# Patient Record
Sex: Male | Born: 1980 | Race: Black or African American | Hispanic: No | Marital: Single | Smoking: Never smoker
Health system: Southern US, Community
[De-identification: ages and names within clinical notes are randomized; demographics above are authoritative.]

---

## 2014-08-28 ENCOUNTER — Emergency Department (HOSPITAL_COMMUNITY)
Admission: EM | Admit: 2014-08-28 | Discharge: 2014-08-28 | Disposition: A | Discharge status: Home / Self Care | Payer: BLUE CROSS/BLUE SHIELD | Attending: Emergency Medicine | Admitting: Emergency Medicine

## 2014-08-28 ENCOUNTER — Emergency Department (HOSPITAL_COMMUNITY): Payer: BLUE CROSS/BLUE SHIELD

## 2014-08-28 ENCOUNTER — Encounter (HOSPITAL_COMMUNITY): Payer: Self-pay | Admitting: Emergency Medicine

## 2014-08-28 DIAGNOSIS — K115 Sialolithiasis: Secondary | ICD-10-CM | POA: Insufficient documentation

## 2014-08-28 DIAGNOSIS — K118 Other diseases of salivary glands: Secondary | ICD-10-CM | POA: Diagnosis not present

## 2014-08-28 DIAGNOSIS — Z79899 Other long term (current) drug therapy: Secondary | ICD-10-CM | POA: Diagnosis not present

## 2014-08-28 DIAGNOSIS — Z792 Long term (current) use of antibiotics: Secondary | ICD-10-CM | POA: Diagnosis not present

## 2014-08-28 DIAGNOSIS — K1370 Unspecified lesions of oral mucosa: Secondary | ICD-10-CM | POA: Diagnosis present

## 2014-08-28 DIAGNOSIS — R221 Localized swelling, mass and lump, neck: Secondary | ICD-10-CM

## 2014-08-28 DIAGNOSIS — K1120 Sialoadenitis, unspecified: Secondary | ICD-10-CM

## 2014-08-28 LAB — CBC
HCT: 44.5 % (ref 39.0–52.0)
Hemoglobin: 15.7 g/dL (ref 13.0–17.0)
MCH: 30 pg (ref 26.0–34.0)
MCHC: 35.3 g/dL (ref 30.0–36.0)
MCV: 85.1 fL (ref 78.0–100.0)
PLATELETS: 258 10*3/uL (ref 150–400)
RBC: 5.23 MIL/uL (ref 4.22–5.81)
RDW: 12.2 % (ref 11.5–15.5)
WBC: 7.9 10*3/uL (ref 4.0–10.5)

## 2014-08-28 LAB — I-STAT CHEM 8, ED
BUN: 13 mg/dL (ref 6–20)
CHLORIDE: 97 mmol/L — AB (ref 101–111)
CREATININE: 0.9 mg/dL (ref 0.61–1.24)
Calcium, Ion: 1.16 mmol/L (ref 1.12–1.23)
Glucose, Bld: 100 mg/dL — ABNORMAL HIGH (ref 65–99)
HEMATOCRIT: 50 % (ref 39.0–52.0)
Hemoglobin: 17 g/dL (ref 13.0–17.0)
Potassium: 5.7 mmol/L — ABNORMAL HIGH (ref 3.5–5.1)
SODIUM: 134 mmol/L — AB (ref 135–145)
TCO2: 29 mmol/L (ref 0–100)

## 2014-08-28 MED ORDER — HYDROCODONE-ACETAMINOPHEN 5-325 MG PO TABS
2.0000 | ORAL_TABLET | ORAL | Status: DC | PRN
Start: 2014-08-28 — End: 2017-12-12

## 2014-08-28 MED ORDER — KETOROLAC TROMETHAMINE 30 MG/ML IJ SOLN
30.0000 mg | Freq: Once | INTRAMUSCULAR | Status: AC
  Administered 2014-08-28: 30 mg via INTRAVENOUS
  Filled 2014-08-28: qty 1

## 2014-08-28 MED ORDER — IBUPROFEN 800 MG PO TABS: 800.0000 mg | ORAL_TABLET | Freq: Three times a day (TID) | ORAL | Status: DC

## 2014-08-28 MED ORDER — IOHEXOL 300 MG/ML  SOLN
100.0000 mL | Freq: Once | INTRAMUSCULAR | Status: AC | PRN
  Administered 2014-08-28: 100 mL via INTRAVENOUS

## 2014-08-28 NOTE — Discharge Instructions (Signed)
Continue taking the Clindamycin  Suck on lemon drops  Motrin or hydrocodone for pain

## 2014-08-28 NOTE — ED Notes (Signed)
Awake. Verbally responsive. A/O x4. Resp even and unlabored. No audible adventitious breath sounds noted. ABC's intact.  

## 2014-08-28 NOTE — ED Notes (Signed)
Pt reported wanting evaluation of salivary gland to make sure not blocked. Pt reported area has improved with ABT. A/O x4. Resp even and unlabored. No audible adventitious breath sounds noted. ABC's intact. Pt reported some difficulty with swallowing and chewing.

## 2014-08-28 NOTE — ED Notes (Signed)
Pt was diagnosed with lower salivary gland infection on Thursday. Was prescribed abx, which helped with redness and swelling. Still has some swelling. Concerned for possible salivary gland stone, so came in for evaluation

## 2014-08-28 NOTE — ED Provider Notes (Signed)
CSN: [id number]     Arrival date & time 08/28/14  4098 History   First MD Initiated Contact with Patient 08/28/14 [phone]     Chief Complaint  Patient presents with  . Mouth Lesions     (Consider location/radiation/quality/duration/timing/severity/associated sxs/prior Treatment) HPI Comments: Pt is a 34 y/o male - hx of no PMH - he has had swelling under his tongue for several days - this is associated with pain in the R neck under the jaw - it is constant, gradually worsening, not associated with fevers but he does have pain with swallowing.  He saw urgent care 2 days ago and was Rx Clinda for possible gland infection - has has 2 days q 6 hours 300 clinda with minimal changes.    Patient is a 34 y.o. male presenting with mouth sores. The history is provided by the patient.  Mouth Lesions Associated symptoms: neck pain   Associated symptoms: no fever     History reviewed. No pertinent past medical history. History reviewed. No pertinent past surgical history. History reviewed. No pertinent family history. History  Substance Use Topics  . Smoking status: Never Smoker   . Smokeless tobacco: Not on file  . Alcohol Use: No    Review of Systems  Constitutional: Negative for fever and chills.  HENT: Positive for mouth sores.   Respiratory: Negative for shortness of breath.   Cardiovascular: Negative for chest pain.  Gastrointestinal: Negative for nausea and vomiting.  Musculoskeletal: Positive for neck pain.      Allergies  Review of patient's allergies indicates no known allergies.  Home Medications   Prior to Admission medications   Medication Sig Start Date End Date Taking? Authorizing Provider  acetaminophen (TYLENOL) 500 MG tablet Take 500 mg by mouth every 6 (six) hours as needed for mild pain or headache.   Yes Historical Provider, MD  clindamycin (CLEOCIN) 300 MG capsule Take 300 mg by mouth 4 (four) times daily.   Yes Historical Provider, MD  Flaxseed, Linseed, (FLAX  SEED OIL PO) Take 1 capsule by mouth daily.   Yes Historical Provider, MD  HYDROcodone-acetaminophen (NORCO/VICODIN) 5-325 MG per tablet Take 2 tablets by mouth every 4 (four) hours as needed. 08/28/14   Eber Hong, MD  ibuprofen (ADVIL,MOTRIN) 800 MG tablet Take 1 tablet (800 mg total) by mouth 3 (three) times daily. 08/28/14   Eber Hong, MD   BP 138/85 mmHg  Pulse 58  Temp(Src) 98.7 F (37.1 C) (Oral)  Resp 20  SpO2 98% Physical Exam  Constitutional: He appears well-developed and well-nourished. No distress.  HENT:  Head: Normocephalic and atraumatic.  Mouth/Throat: Oropharynx is clear and moist.  Dentition without acute findings - submandibular gland swollen on the R with duct swollen with purulent material drainage expresse dfrom the duct  Eyes: Conjunctivae and EOM are normal. Pupils are equal, round, and reactive to light. Right eye exhibits no discharge. Left eye exhibits no discharge. No scleral icterus.  Neck: Normal range of motion. Neck supple. No JVD present.  No thyromegaly, swelling submandibular mass  Cardiovascular: Normal rate, regular rhythm, normal heart sounds and intact distal pulses.  Exam reveals no gallop and no friction rub.   No murmur heard. Pulmonary/Chest: Effort normal and breath sounds normal. No respiratory distress. He has no wheezes. He has no rales.  Abdominal: Soft. Bowel sounds are normal. He exhibits no distension and no mass. There is no tenderness.  Musculoskeletal: Normal range of motion. He exhibits no edema or tenderness.  Neurological:  He is alert. Coordination normal.  Skin: Skin is warm and dry. No rash noted. No erythema.  Psychiatric: He has a normal mood and affect. His behavior is normal.  Nursing note and vitals reviewed.   ED Course  Procedures (including critical care time) Labs Review Labs Reviewed  I-STAT CHEM 8, ED - Abnormal; Notable for the following:    Sodium 134 (*)    Potassium 5.7 (*)    Chloride 97 (*)    Glucose,  Bld 100 (*)    All other components within normal limits  CBC    Imaging Review Ct Soft Tissue Neck W Contrast  08/28/2014   CLINICAL DATA:  Right submandibular gland infection/inflammation. Question stone.  EXAM: CT NECK WITH CONTRAST  TECHNIQUE: Multidetector CT imaging of the neck was performed using the standard protocol following the bolus administration of intravenous contrast.  CONTRAST:  OMNIPAQUE IOHEXOL 300 MG/ML  SOLN  COMPARISON:  None.  FINDINGS: Pharynx and larynx: No focal mucosal or submucosal lesions are present. Tongue base is within normal limits. Vocal cords are midline and symmetric.  Salivary glands: Marked edema and asymmetric enhancement is present within an enlarged right submandibular gland. The right submandibular duct is markedly dilated, measuring up to 7 mm. An obstructing distal sialolith measures 10 x 6.5 x 6.5 mm. A more distal sialolith measures 7 x 4 x 4 mm. No additional stones are evident within the gland. The intraglandular ducts are dilated as well. There is no stone or obstruction on the left. The left submandibular gland is within normal limits. The parotid glands are normal bilaterally.  Thyroid: Negative.  Lymph nodes: Asymmetric enlargement of right level 1 B and level 2 lymph nodes are likely reactive  Vascular: Negative  Limited intracranial: Within normal limits  Visualized orbits: Negative  Mastoids and visualized paranasal sinuses: Clear  Skeleton: Unremarkable  Upper chest: The lung apices are clear.  IMPRESSION: 1. Two large obstructing stones within the distal right submandibular duct. The larger stone measures 10 x 6.5 x 6.5 mm. The more distal stone measures 7 x 4 x 4 mm. 2. Marked dilation of the proximal right submandibular duct an intraglandular ducts with inflammatory changes about the right submandibular gland. 3. Asymmetric right level 1 B and level 2 adenopathy is likely reactive. 4. No other salivary gland stones are present.   Electronically  Signed   By: Marin Roberts M.D.   On: 08/28/2014 11:34      MDM   Final diagnoses:  Neck swelling  Submandibular gland infection  Sialolithiasis of submandibular gland    The patient has normal phonation, able to tolerate secretions, there is no swelling of the oropharynx, he has tenderness under the tongue where this swollen duct is with purulent material expressed, CT scan ordered to evaluate the extent of this infection.  D/w Dr. Emeline Darling who agrees with pt in office on Monday - continued Clindamycin - pt given results and is in agreement with plan.  Obstructed salivary duct  Meds given in ED:  Medications  iohexol (OMNIPAQUE) 300 MG/ML solution 100 mL (100 mLs Intravenous Contrast Given 08/28/14 1034)  ketorolac (TORADOL) 30 MG/ML injection 30 mg (30 mg Intravenous Given 08/28/14 1207)    Discharge Medication List as of 08/28/2014 12:23 PM    START taking these medications   Details  HYDROcodone-acetaminophen (NORCO/VICODIN) 5-325 MG per tablet Take 2 tablets by mouth every 4 (four) hours as needed., Starting 08/28/2014, Until Discontinued, Print  Eber HongBrian Oneisha Ammons, MD 08/28/14 [phone]

## 2014-10-08 ENCOUNTER — Encounter (HOSPITAL_COMMUNITY): Payer: Self-pay | Admitting: *Deleted

## 2014-10-08 ENCOUNTER — Emergency Department (HOSPITAL_COMMUNITY)
Admission: EM | Admit: 2014-10-08 | Discharge: 2014-10-08 | Disposition: A | Discharge status: Home / Self Care | Payer: BLUE CROSS/BLUE SHIELD | Attending: Emergency Medicine | Admitting: Emergency Medicine

## 2014-10-08 DIAGNOSIS — S161XXA Strain of muscle, fascia and tendon at neck level, initial encounter: Secondary | ICD-10-CM | POA: Diagnosis not present

## 2014-10-08 DIAGNOSIS — Z79899 Other long term (current) drug therapy: Secondary | ICD-10-CM | POA: Insufficient documentation

## 2014-10-08 DIAGNOSIS — Y92410 Unspecified street and highway as the place of occurrence of the external cause: Secondary | ICD-10-CM | POA: Insufficient documentation

## 2014-10-08 DIAGNOSIS — Y998 Other external cause status: Secondary | ICD-10-CM | POA: Insufficient documentation

## 2014-10-08 DIAGNOSIS — S199XXA Unspecified injury of neck, initial encounter: Secondary | ICD-10-CM | POA: Diagnosis present

## 2014-10-08 DIAGNOSIS — Y9389 Activity, other specified: Secondary | ICD-10-CM | POA: Diagnosis not present

## 2014-10-08 MED ORDER — CYCLOBENZAPRINE HCL 10 MG PO TABS: 10.0000 mg | ORAL_TABLET | Freq: Two times a day (BID) | ORAL | Status: DC | PRN

## 2014-10-08 MED ORDER — MELOXICAM 15 MG PO TABS: 15.0000 mg | ORAL_TABLET | Freq: Every day | ORAL | Status: DC
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
## 2014-10-08 NOTE — Discharge Instructions (Signed)
Take mobic as needed for pain. Take flexeril as needed for muscle spasm. Refer to attached documents for more information.  °

## 2016-06-28 IMAGING — CT CT NECK W/ CM
4 of 5 series · 16 of 33 positions shown, 18 images · IV contrast (OMNIPAQUE 300)
Comparison: None.

CLINICAL DATA: Right submandibular gland infection/inflammation.
Question stone.

EXAM:
CT NECK WITH CONTRAST
TECHNIQUE: Multidetector CT imaging of the neck was performed using the
standard protocol following the bolus administration of intravenous
contrast.
CONTRAST:  100mL OMNIPAQUE IOHEXOL 300 MG/ML  SOLN

[Series 3: neck with st · axial · 0.44mm/px · z∈[-218,-80]mm · 4 of 116 slices shown, 5 images]
[im 24/116  soft-tissue]
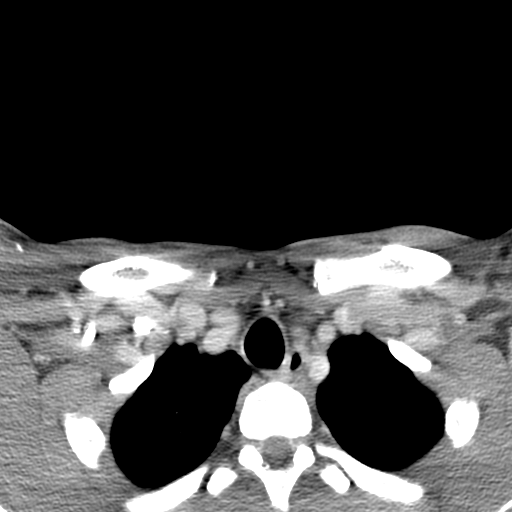
[im 24/116  bone]
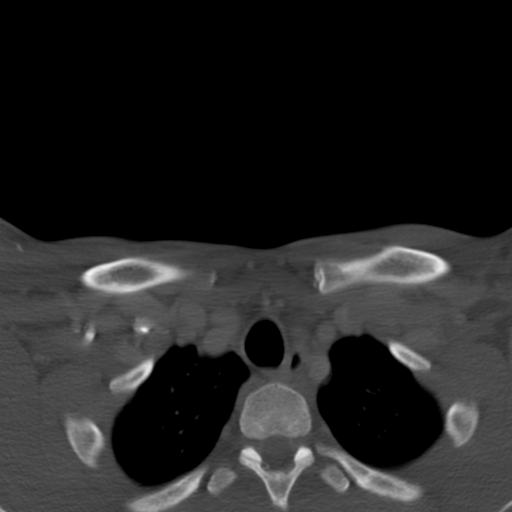
[im 47/116  bone]
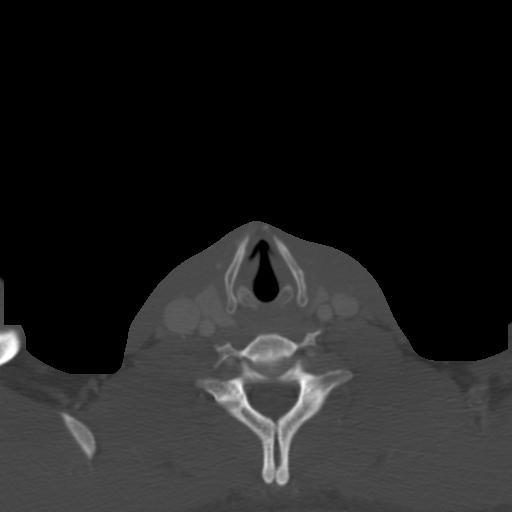
[im 70/116  bone]
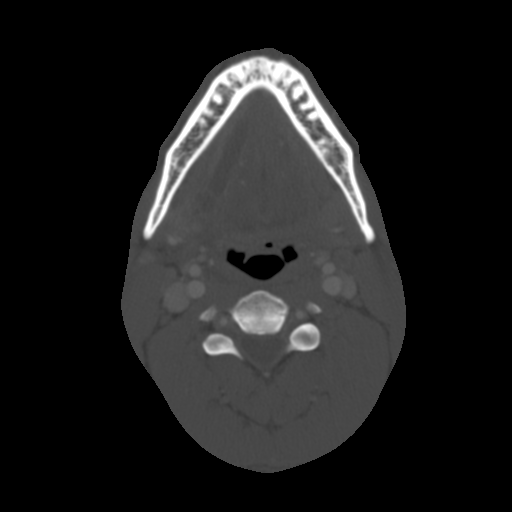
[im 93/116  bone]
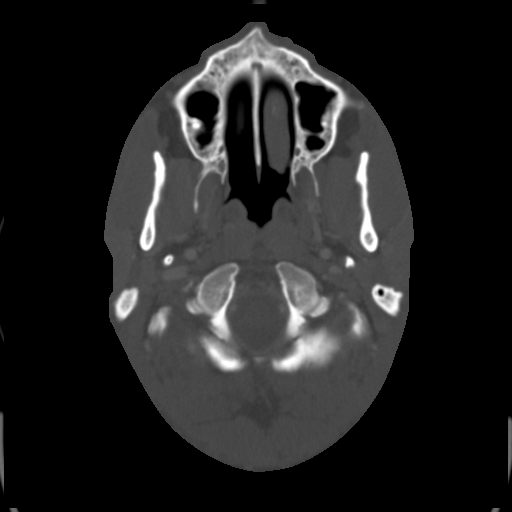

[Series 6: coronal st · coronal · 0.50mm/px · 3 of 101 slices shown]
[im 34/101  bone]
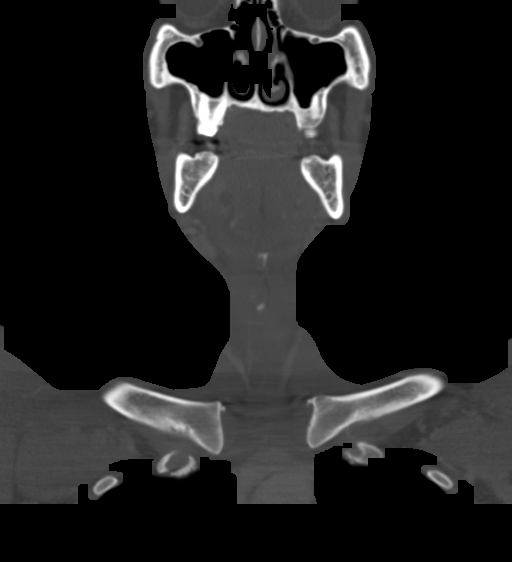
[im 45/101  bone]
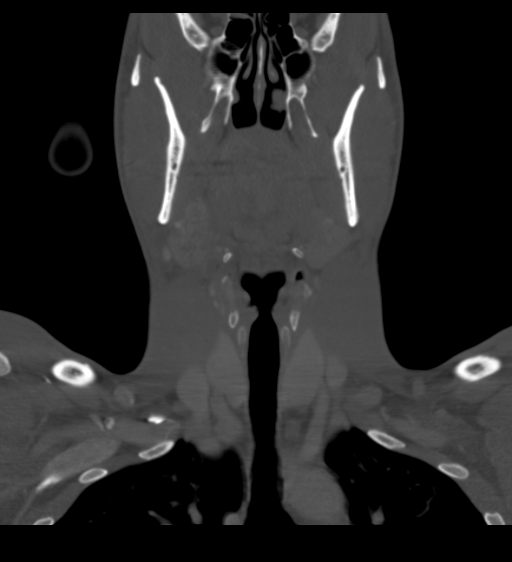
[im 56/101  bone]
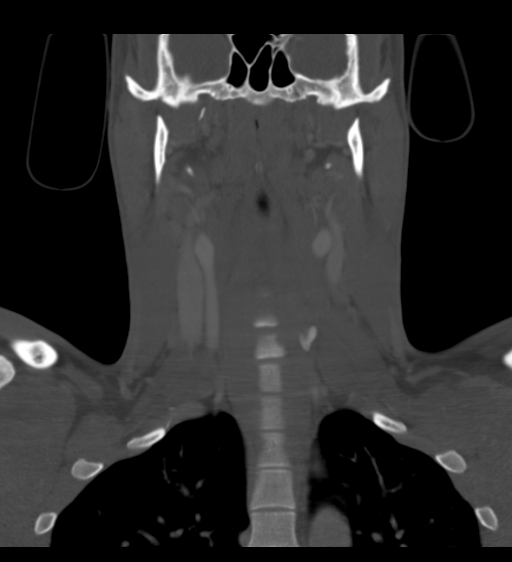

[Series 7: sagittal st · sagittal · 0.46mm/px · 5 of 81 slices shown, 6 images]
[im 27/81  bone]
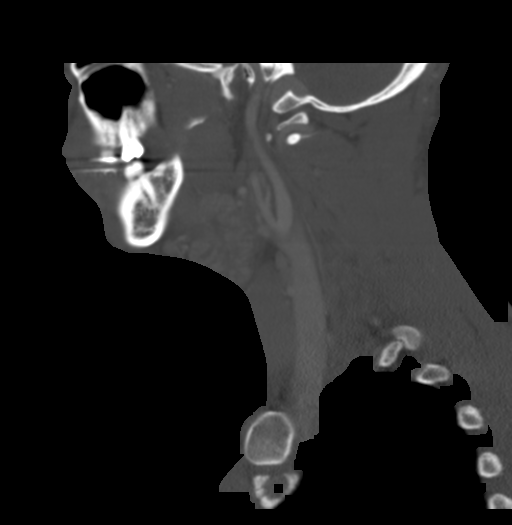
[im 34/81  bone]
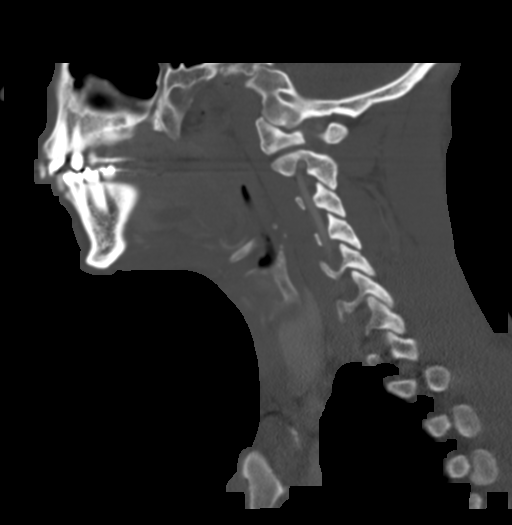
[im 41/81  soft-tissue]
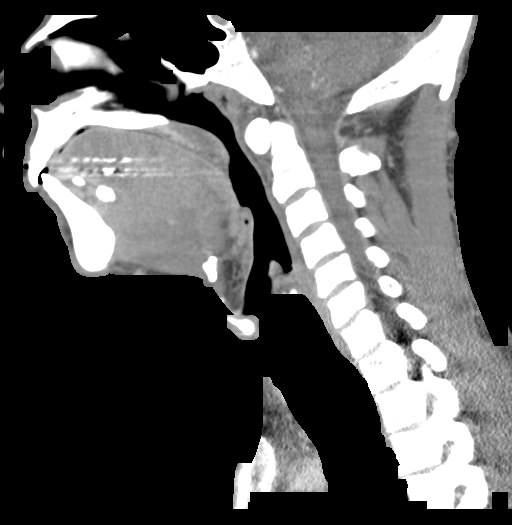
[im 41/81  bone]
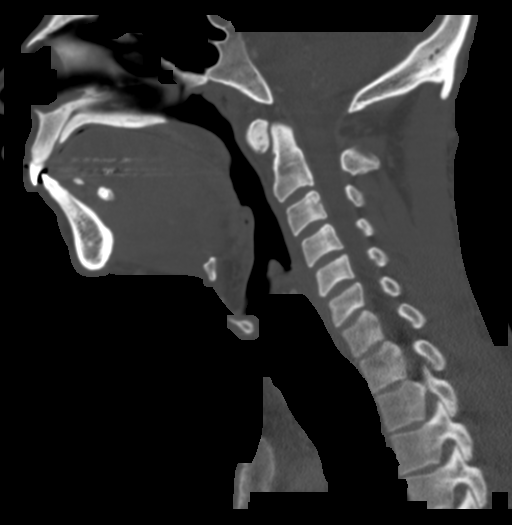
[im 47/81  bone]
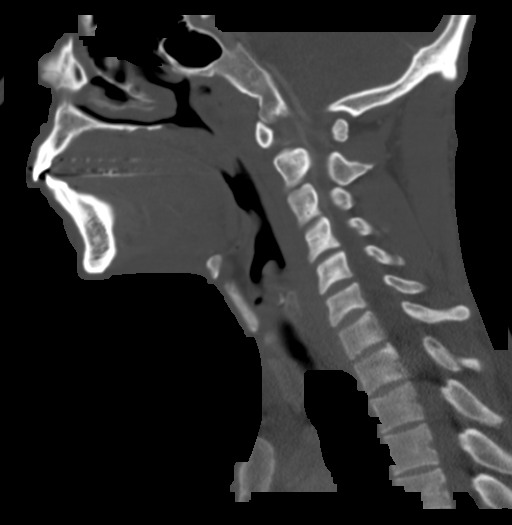
[im 54/81  bone]
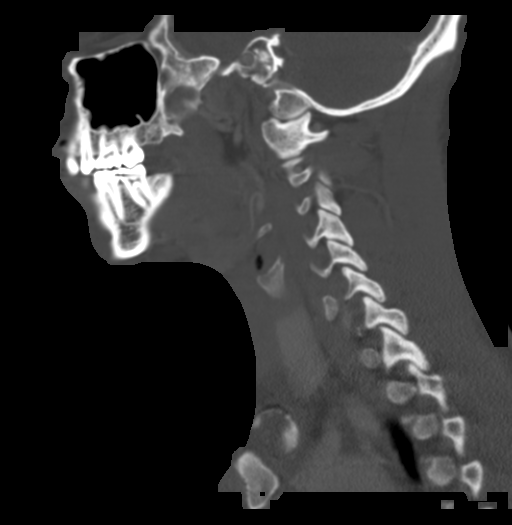

[Series 8: axial recons · axial · 0.39mm/px · z∈[-275,-125]mm · 4 of 135 slices shown]
[im 27/135  bone]
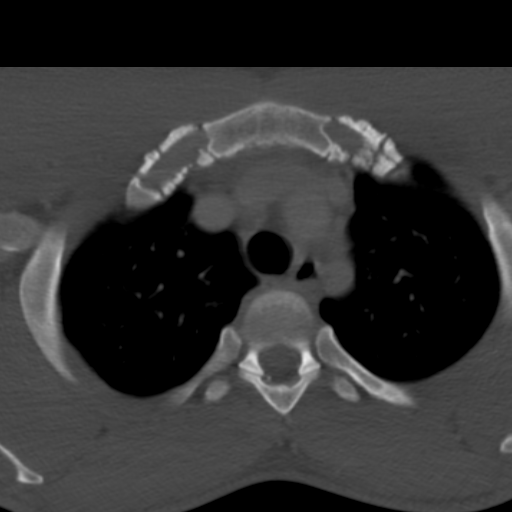
[im 54/135  bone]
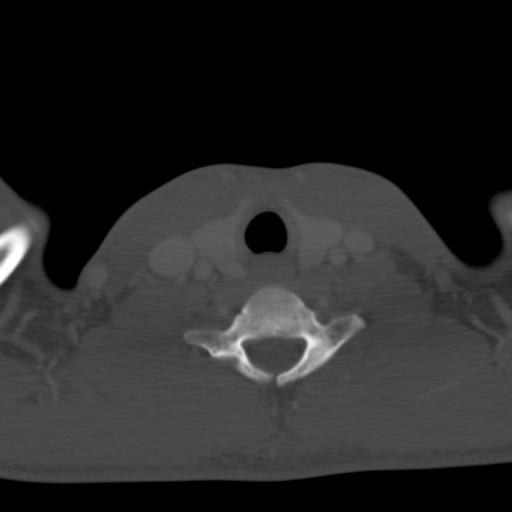
[im 81/135  bone]
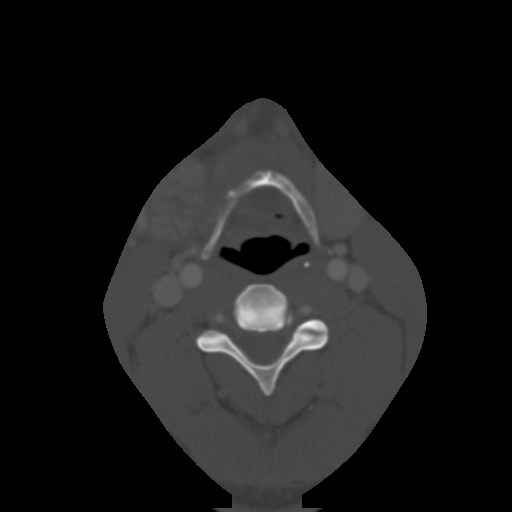
[im 108/135  bone]
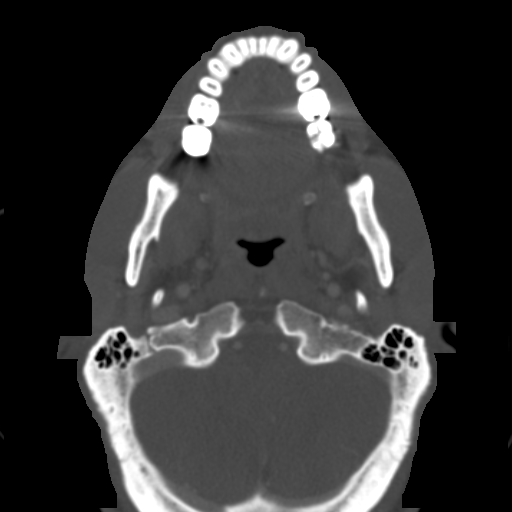

[16 of 33 positions shown; findings below may reference images not displayed]

FINDINGS: Pharynx and larynx: No focal mucosal or submucosal lesions are
present. Tongue base is within normal limits. Vocal cords are
midline and symmetric.

Salivary glands: Marked edema and asymmetric enhancement is present
within an enlarged right submandibular gland. The right
submandibular duct is markedly dilated, measuring up to 7 mm. An
obstructing distal sialolith measures 10 x 6.5 x 6.5 mm. A more
distal sialolith measures 7 x 4 x 4 mm. No additional stones are
evident within the gland. The intraglandular ducts are dilated as
well. There is no stone or obstruction on the left. The left
submandibular gland is within normal limits. The parotid glands are
normal bilaterally.

Thyroid: Negative.

Lymph nodes: Asymmetric enlargement of right level 1 B and level 2
lymph nodes are likely reactive

Vascular: Negative

Limited intracranial: Within normal limits

Visualized orbits: Negative

Mastoids and visualized paranasal sinuses: Clear

Skeleton: Unremarkable

Upper chest: The lung apices are clear.
IMPRESSION: 1. Two large obstructing stones within the distal right
submandibular duct. The larger stone measures 10 x 6.5 x 6.5 mm. The
more distal stone measures 7 x 4 x 4 mm.
2. Marked dilation of the proximal right submandibular duct an
intraglandular ducts with inflammatory changes about the right
submandibular gland.
3. Asymmetric right level 1 B and level 2 adenopathy is likely
reactive.
4. No other salivary gland stones are present.

## 2017-10-16 ENCOUNTER — Emergency Department (HOSPITAL_COMMUNITY)
Admission: EM | Admit: 2017-10-16 | Discharge: 2017-10-16 | Disposition: A | Discharge status: Home / Self Care | Payer: BLUE CROSS/BLUE SHIELD | Attending: Emergency Medicine | Admitting: Emergency Medicine

## 2017-10-16 ENCOUNTER — Encounter (HOSPITAL_COMMUNITY): Payer: Self-pay | Admitting: *Deleted

## 2017-10-16 DIAGNOSIS — S161XXA Strain of muscle, fascia and tendon at neck level, initial encounter: Secondary | ICD-10-CM

## 2017-10-16 DIAGNOSIS — Y998 Other external cause status: Secondary | ICD-10-CM | POA: Insufficient documentation

## 2017-10-16 DIAGNOSIS — Y9389 Activity, other specified: Secondary | ICD-10-CM | POA: Insufficient documentation

## 2017-10-16 DIAGNOSIS — Z79899 Other long term (current) drug therapy: Secondary | ICD-10-CM | POA: Insufficient documentation

## 2017-10-16 DIAGNOSIS — Y92410 Unspecified street and highway as the place of occurrence of the external cause: Secondary | ICD-10-CM | POA: Insufficient documentation

## 2017-10-16 DIAGNOSIS — S66912A Strain of unspecified muscle, fascia and tendon at wrist and hand level, left hand, initial encounter: Secondary | ICD-10-CM | POA: Insufficient documentation

## 2017-10-16 MED ORDER — IBUPROFEN 800 MG PO TABS: 800.0000 mg | ORAL_TABLET | Freq: Three times a day (TID) | ORAL | 0 refills | Status: DC | PRN
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
## 2017-10-16 NOTE — ED Notes (Signed)
Bed: WTR6 Expected date:  Expected time:  Means of arrival:  Comments: 
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
## 2017-12-12 ENCOUNTER — Emergency Department (HOSPITAL_COMMUNITY): Payer: No Typology Code available for payment source

## 2017-12-12 ENCOUNTER — Encounter (HOSPITAL_COMMUNITY): Payer: Self-pay | Admitting: Emergency Medicine

## 2017-12-12 ENCOUNTER — Emergency Department (HOSPITAL_COMMUNITY)
Admission: EM | Admit: 2017-12-12 | Discharge: 2017-12-12 | Disposition: A | Discharge status: Home / Self Care | Payer: No Typology Code available for payment source | Attending: Emergency Medicine | Admitting: Emergency Medicine

## 2017-12-12 DIAGNOSIS — R51 Headache: Secondary | ICD-10-CM | POA: Insufficient documentation

## 2017-12-12 DIAGNOSIS — R9389 Abnormal findings on diagnostic imaging of other specified body structures: Secondary | ICD-10-CM | POA: Insufficient documentation

## 2017-12-12 DIAGNOSIS — M542 Cervicalgia: Secondary | ICD-10-CM | POA: Insufficient documentation

## 2017-12-12 DIAGNOSIS — G44319 Acute post-traumatic headache, not intractable: Secondary | ICD-10-CM

## 2017-12-12 MED ORDER — IBUPROFEN 600 MG PO TABS: 600.0000 mg | ORAL_TABLET | Freq: Four times a day (QID) | ORAL | 0 refills | Status: AC | PRN

## 2017-12-12 MED ORDER — METHOCARBAMOL 500 MG PO TABS: 500.0000 mg | ORAL_TABLET | Freq: Two times a day (BID) | ORAL | 0 refills | Status: AC
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
## 2017-12-12 NOTE — ED Notes (Signed)
PA made aware that pt requesting update
